# Patient Record
Sex: Male | Born: 2000 | Race: White | Hispanic: No | Marital: Single | Smoking: Never smoker
Health system: Southern US, Community
[De-identification: ages and names within clinical notes are randomized; demographics above are authoritative.]

## PROBLEM LIST (undated history)

## (undated) DIAGNOSIS — F909 Attention-deficit hyperactivity disorder, unspecified type: Secondary | ICD-10-CM

## (undated) HISTORY — PX: TONSILLECTOMY: SUR1361

## (undated) HISTORY — PX: HERNIA REPAIR: SHX51

---
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
## 2023-01-26 ENCOUNTER — Other Ambulatory Visit: Payer: Self-pay | Admitting: Internal Medicine
